# Patient Record
Sex: Female | Born: 1970 | Race: White | Hispanic: Yes | Marital: Married | Smoking: Never smoker
Health system: Southern US, Community
[De-identification: ages and names within clinical notes are randomized; demographics above are authoritative.]

## PROBLEM LIST (undated history)

## (undated) DIAGNOSIS — I10 Essential (primary) hypertension: Secondary | ICD-10-CM

## (undated) DIAGNOSIS — N912 Amenorrhea, unspecified: Secondary | ICD-10-CM

## (undated) DIAGNOSIS — M810 Age-related osteoporosis without current pathological fracture: Secondary | ICD-10-CM

## (undated) DIAGNOSIS — E785 Hyperlipidemia, unspecified: Secondary | ICD-10-CM

## (undated) DIAGNOSIS — J9859 Other diseases of mediastinum, not elsewhere classified: Secondary | ICD-10-CM

## (undated) DIAGNOSIS — F439 Reaction to severe stress, unspecified: Secondary | ICD-10-CM

## (undated) DIAGNOSIS — M1990 Unspecified osteoarthritis, unspecified site: Secondary | ICD-10-CM

## (undated) DIAGNOSIS — G4730 Sleep apnea, unspecified: Secondary | ICD-10-CM

## (undated) DIAGNOSIS — K219 Gastro-esophageal reflux disease without esophagitis: Secondary | ICD-10-CM

## (undated) DIAGNOSIS — R7303 Prediabetes: Secondary | ICD-10-CM

## (undated) DIAGNOSIS — B977 Papillomavirus as the cause of diseases classified elsewhere: Secondary | ICD-10-CM

## (undated) DIAGNOSIS — R5383 Other fatigue: Secondary | ICD-10-CM

## (undated) DIAGNOSIS — G4733 Obstructive sleep apnea (adult) (pediatric): Secondary | ICD-10-CM

## (undated) DIAGNOSIS — R12 Heartburn: Secondary | ICD-10-CM

## (undated) DIAGNOSIS — D649 Anemia, unspecified: Secondary | ICD-10-CM

## (undated) DIAGNOSIS — H269 Unspecified cataract: Secondary | ICD-10-CM

## (undated) DIAGNOSIS — K279 Peptic ulcer, site unspecified, unspecified as acute or chronic, without hemorrhage or perforation: Secondary | ICD-10-CM

## (undated) HISTORY — DX: Papillomavirus as the cause of diseases classified elsewhere: B97.7

## (undated) HISTORY — DX: Unspecified cataract: H26.9

## (undated) HISTORY — DX: Hyperlipidemia, unspecified: E78.5

## (undated) HISTORY — DX: Essential (primary) hypertension: I10

## (undated) HISTORY — DX: Obstructive sleep apnea (adult) (pediatric): G47.33

## (undated) HISTORY — DX: Gastro-esophageal reflux disease without esophagitis: K21.9

## (undated) HISTORY — DX: Prediabetes: R73.03

## (undated) HISTORY — DX: Other fatigue: R53.83

## (undated) HISTORY — DX: Other diseases of mediastinum, not elsewhere classified: J98.59

## (undated) HISTORY — DX: Amenorrhea, unspecified: N91.2

## (undated) HISTORY — DX: Age-related osteoporosis without current pathological fracture: M81.0

## (undated) HISTORY — DX: Peptic ulcer, site unspecified, unspecified as acute or chronic, without hemorrhage or perforation: K27.9

## (undated) HISTORY — DX: Heartburn: R12

## (undated) HISTORY — DX: Reaction to severe stress, unspecified: F43.9

## (undated) HISTORY — DX: Unspecified osteoarthritis, unspecified site: M19.90

## (undated) HISTORY — PX: OTHER SURGICAL HISTORY: SHX169

## (undated) HISTORY — DX: Anemia, unspecified: D64.9

## (undated) HISTORY — DX: Sleep apnea, unspecified: G47.30

---

## 2010-05-21 ENCOUNTER — Encounter: Admission: RE | Admit: 2010-05-21 | Discharge: 2010-05-21 | Payer: Self-pay | Admitting: Specialist

## 2011-11-12 ENCOUNTER — Other Ambulatory Visit: Payer: Self-pay | Admitting: Physician Assistant

## 2011-11-12 ENCOUNTER — Ambulatory Visit
Admission: RE | Admit: 2011-11-12 | Discharge: 2011-11-12 | Disposition: A | Discharge status: Home / Self Care | Payer: No Typology Code available for payment source | Source: Ambulatory Visit | Attending: Physician Assistant | Admitting: Physician Assistant

## 2011-11-12 DIAGNOSIS — M542 Cervicalgia: Secondary | ICD-10-CM

## 2011-11-12 DIAGNOSIS — R200 Anesthesia of skin: Secondary | ICD-10-CM

## 2011-11-23 ENCOUNTER — Other Ambulatory Visit: Payer: Self-pay | Admitting: Specialist

## 2011-11-23 DIAGNOSIS — R109 Unspecified abdominal pain: Secondary | ICD-10-CM

## 2011-11-23 DIAGNOSIS — R110 Nausea: Secondary | ICD-10-CM

## 2011-11-25 ENCOUNTER — Ambulatory Visit
Admission: RE | Admit: 2011-11-25 | Discharge: 2011-11-25 | Disposition: A | Discharge status: Home / Self Care | Payer: No Typology Code available for payment source | Source: Ambulatory Visit | Attending: Specialist | Admitting: Specialist

## 2011-11-25 DIAGNOSIS — R109 Unspecified abdominal pain: Secondary | ICD-10-CM

## 2011-11-25 DIAGNOSIS — R110 Nausea: Secondary | ICD-10-CM

## 2013-12-25 IMAGING — US US ABDOMEN COMPLETE
1 series · 14 of 25 positions shown · non-contrast
Comparison: None.

CLINICAL DATA: Abdominal pain and nausea.

COMPLETE ABDOMINAL ULTRASOUND

[Series 1: us abdomen complete · 0.29mm/px · 14 of 87 slices shown]
[im 1/87]
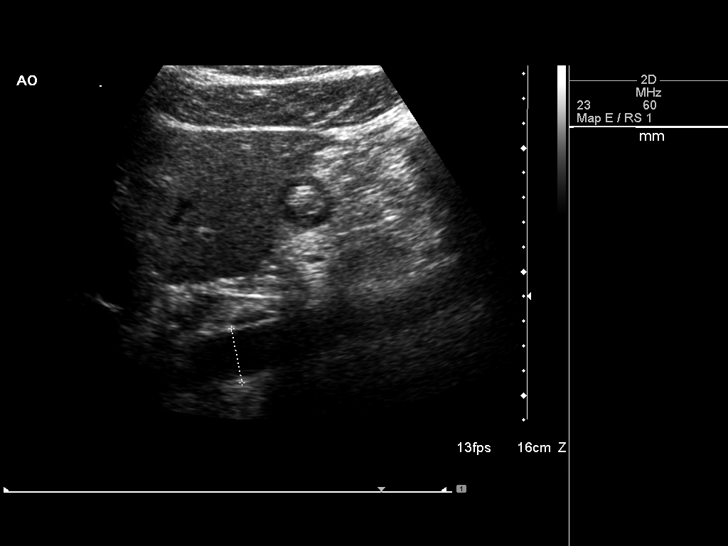
[im 8/87]
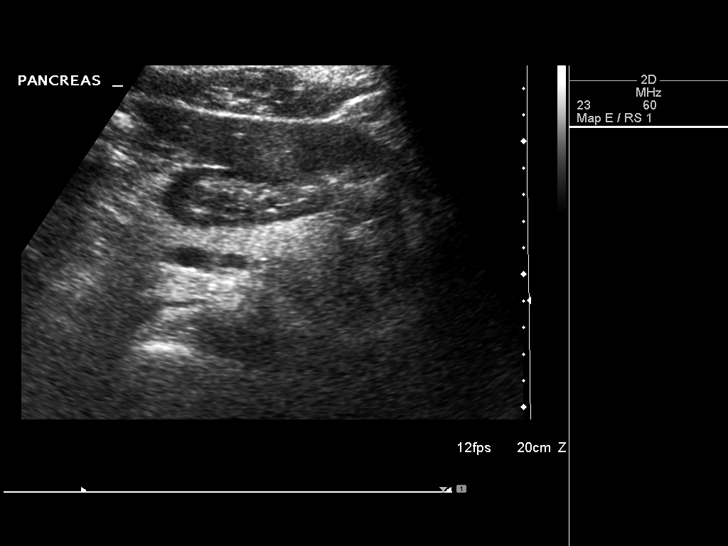
[im 15/87]
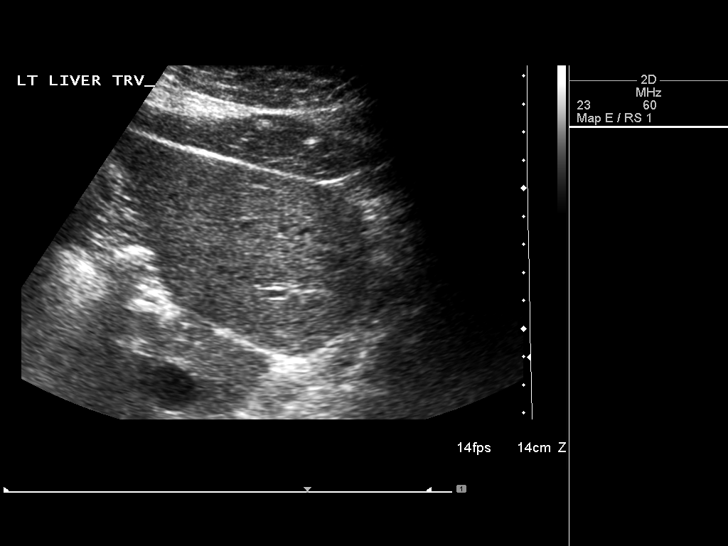
[im 22/87]
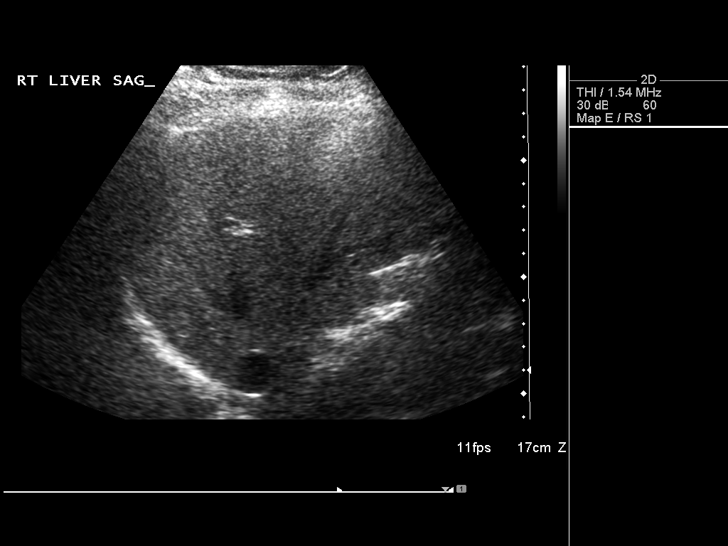
[im 29/87]
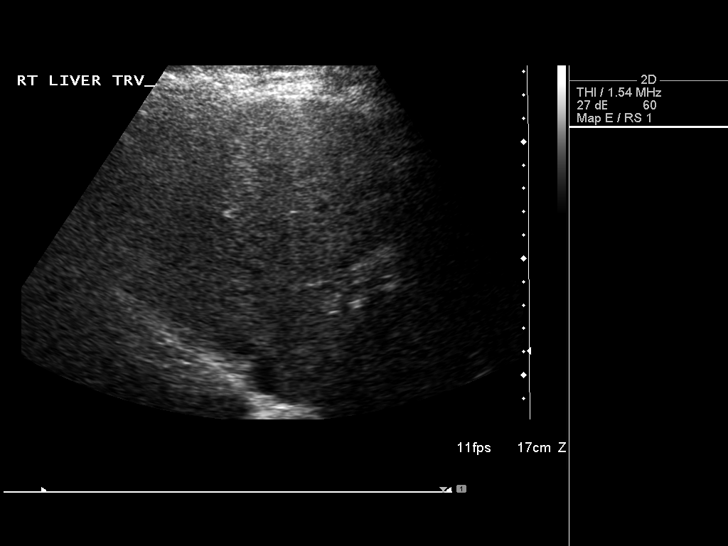
[im 33/87]
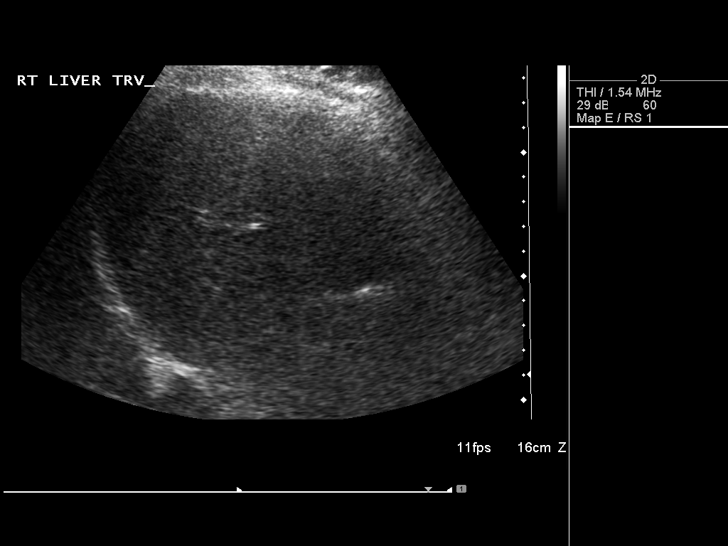
[im 40/87]
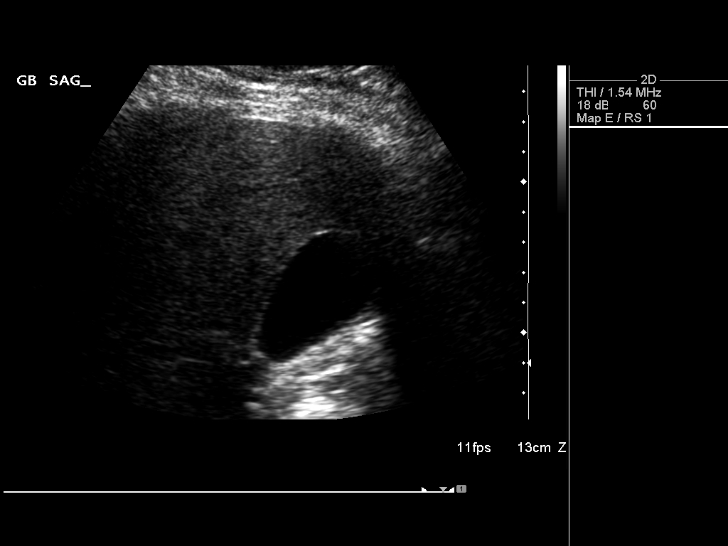
[im 47/87]
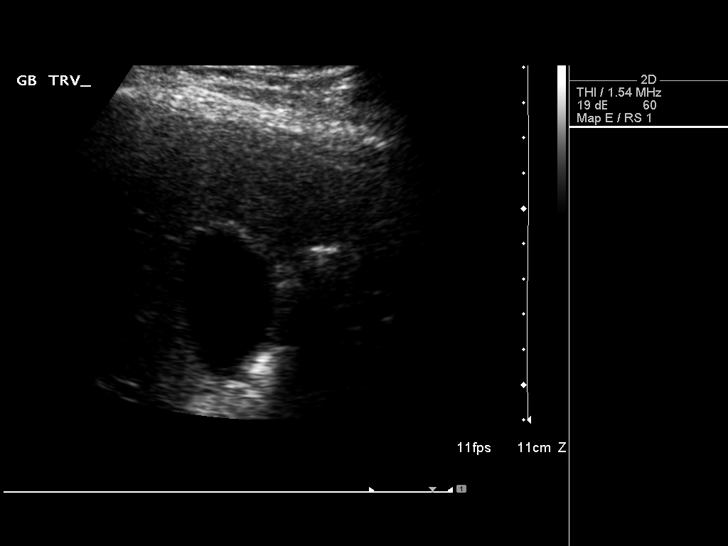
[im 54/87]
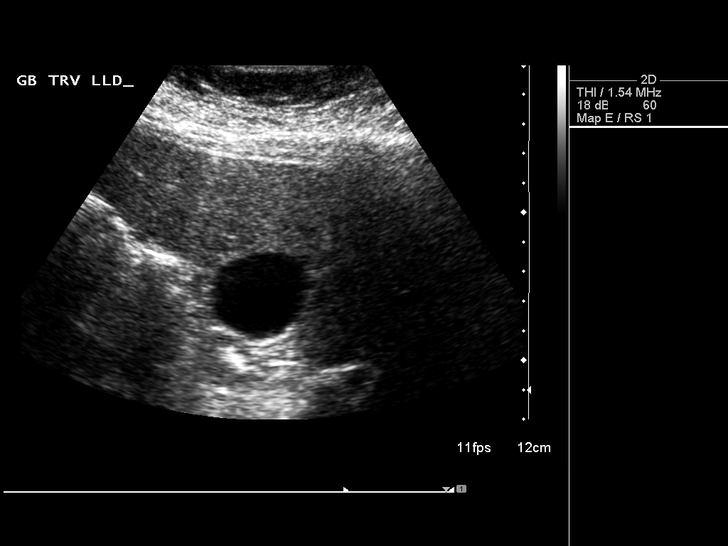
[im 58/87]
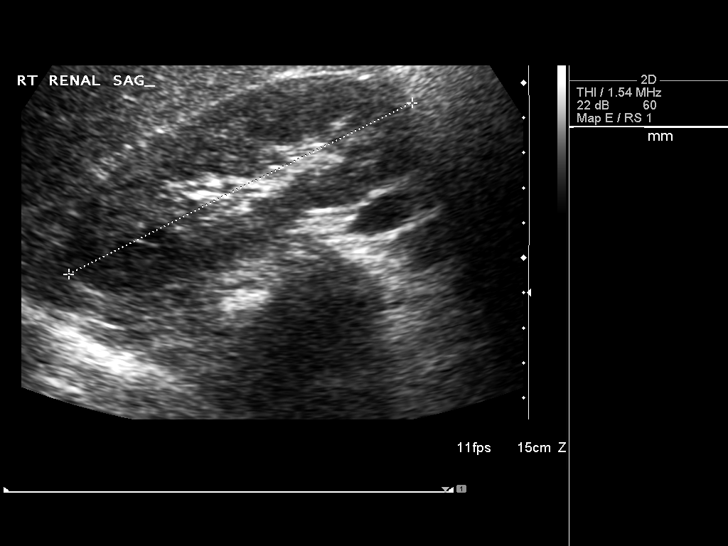
[im 65/87]
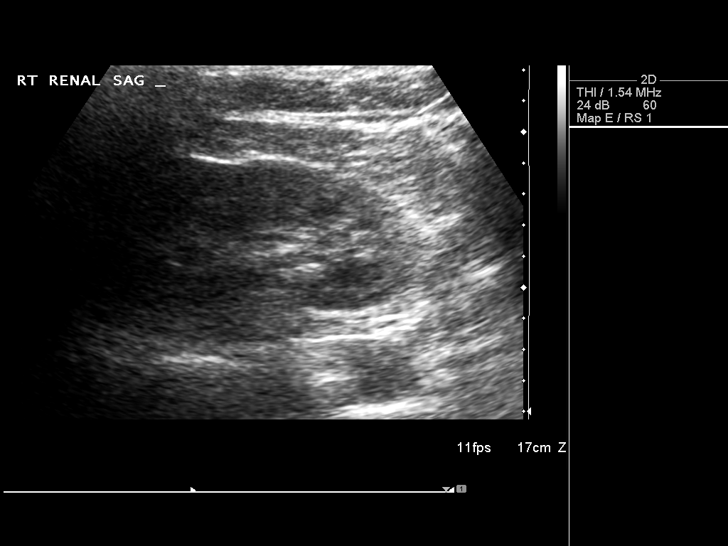
[im 72/87]
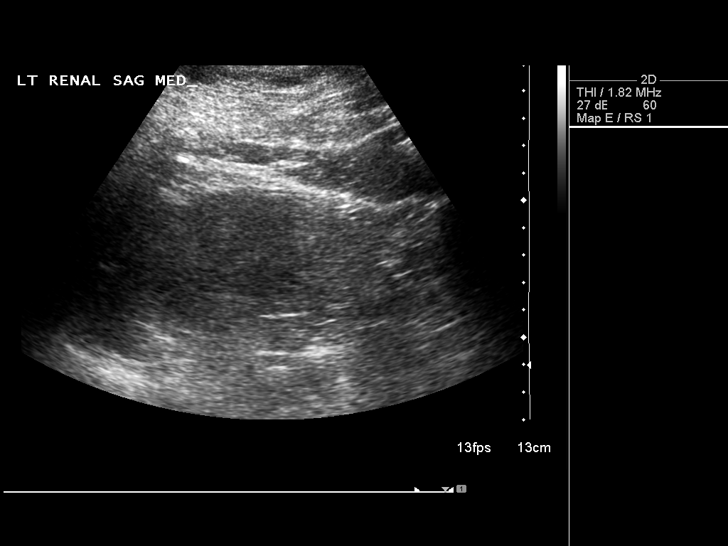
[im 79/87]
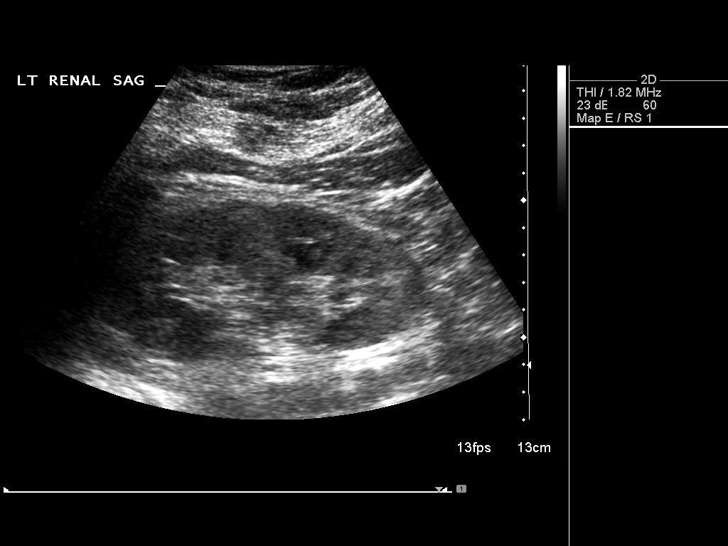
[im 87/87]
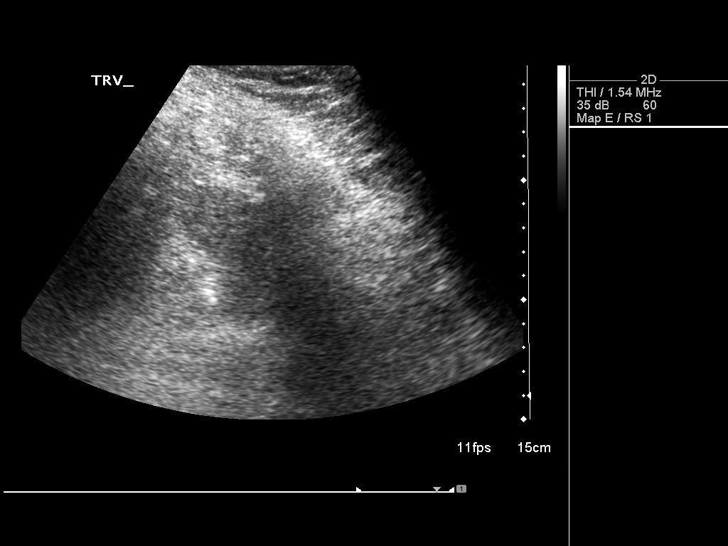

[14 of 25 positions shown; findings below may reference images not displayed]

FINDINGS: Gallbladder:  No gallstones, gallbladder wall thickening, or
pericholecystic fluid.

Common bile duct:  Normal in caliber measuring a maximum of 3.7mm.

Liver:  There is diffuse increased echogenicity of the liver,
decreased through transmission and poor definition of the liver
architecture consistent with fatty infiltration.  No focal lesions
or biliary dilatation.

IVC:  Normal caliber.

Pancreas:  Sonographically normal.

Spleen:  Normal size and echogenicity without focal lesions.

Right Kidney:  11.0 cm in length. Normal renal cortical thickness
and echogenicity without focal lesions or hydronephrosis.

Left Kidney:  12.0 cm in length. Normal renal cortical thickness
and echogenicity without focal lesions or hydronephrosis.

Abdominal aorta:  Normal caliber.
IMPRESSION: 1.  Normal sonographic appearance of the gallbladder and normal
caliber common bile duct.
2.  Diffuse fatty infiltration of the liver.
3.  Unremarkable sonographic appearance of the pancreas, spleen and
both kidneys.

## 2015-04-30 ENCOUNTER — Encounter: Payer: Self-pay | Admitting: Emergency Medicine

## 2019-04-24 ENCOUNTER — Other Ambulatory Visit: Payer: Self-pay

## 2019-04-24 DIAGNOSIS — Z20822 Contact with and (suspected) exposure to covid-19: Secondary | ICD-10-CM

## 2019-04-25 LAB — NOVEL CORONAVIRUS, NAA: SARS-CoV-2, NAA: NOT DETECTED

## 2019-05-01 ENCOUNTER — Encounter: Payer: Self-pay | Admitting: *Deleted

## 2019-05-02 ENCOUNTER — Encounter: Payer: Self-pay | Admitting: *Deleted

## 2019-05-02 ENCOUNTER — Telehealth: Payer: Self-pay | Admitting: *Deleted

## 2019-05-02 ENCOUNTER — Other Ambulatory Visit: Payer: Self-pay

## 2019-05-02 ENCOUNTER — Ambulatory Visit: Payer: Self-pay | Admitting: Neurology

## 2019-05-02 NOTE — Telephone Encounter (Signed)
New patient appt today at 2pm.  Pt requested to arrive at 1:30pm for check-in.  She arrived at 2:10pm.  She needed an interpreter to assist with appt.  Dr. Rhea Belton next patient was already here.  Unable to see her for a late appt today.  She was rescheduled by front desk.

## 2019-05-18 ENCOUNTER — Encounter: Payer: Self-pay | Admitting: Nurse Practitioner

## 2019-06-02 NOTE — Progress Notes (Deleted)
     06/02/2019 Kella Splinter [id number] 01-Oct-1970   HISTORY OF PRESENT ILLNESS:  Lenette Rau is a 48 year old female with a past medical history of arthritis, heartburn, peptic ulcer, pre-diabetes, obstructive sleep apnea.  Interpretor needed ?      Past Medical History:  Diagnosis Date  . Amenorrhea   . Anemia   . Arthritis   . Dyslipidemia   . Fatigue   . Heartburn   . 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 Mediastinal mass   . OSA (obstructive sleep apnea)   . Peptic ulcer   . Prediabetes   . Stress    *** The histories are not reviewed yet. Please review them in the "History" navigator section and refresh this [place].  reports that she has never smoked. She has never used smokeless tobacco. She reports previous alcohol use. She reports that she does not use drugs. family history is not on file. No Known Allergies    Outpatient Encounter Medications as of 06/04/2019  Medication Sig  . acetaminophen (TYLENOL) 500 MG tablet Take 500 mg by mouth as needed.  Marland Kitchen CALCIUM PO Take 1 tablet by mouth daily.  . Cholecalciferol (VITAMIN D3 PO) Take 1 tablet by mouth daily.  . ferrous sulfate 325 (65 FE) MG tablet Take 325 mg by mouth daily.  Marland Kitchen omeprazole (PRILOSEC) 40 MG capsule Take 40 mg by mouth daily.   No facility-administered encounter medications on file as of 06/04/2019.      REVIEW OF SYSTEMS  : All other systems reviewed and negative except where noted in the History of Present Illness.   PHYSICAL EXAM: There were no vitals taken for this visit. General: Well developed white female in no acute distress Head: Normocephalic and atraumatic Eyes:  sclerae anicteric,conjunctive pink. Ears: Normal auditory acuity Neck: Supple, no masses.  Lungs: Clear throughout to auscultation Heart: Regular rate and rhythm Abdomen: Soft, nontender, non distended. No masses or hepatomegaly noted. Normal  bowel sounds Rectal: *** Musculoskeletal: Symmetrical with no gross deformities  Skin: No lesions on visible extremities Extremities: No edema  Neurological: Alert oriented x 4, grossly nonfocal Cervical Nodes:  No significant cervical adenopathy Psychological:  Alert and cooperative. Normal mood and affect  ASSESSMENT AND PLAN:    CC:  Lonergan, Malia A, PA-C

## 2019-06-04 ENCOUNTER — Other Ambulatory Visit: Payer: Self-pay

## 2019-06-04 ENCOUNTER — Ambulatory Visit: Payer: Self-pay | Admitting: Nurse Practitioner

## 2019-06-04 DIAGNOSIS — Z20822 Contact with and (suspected) exposure to covid-19: Secondary | ICD-10-CM

## 2019-06-05 LAB — NOVEL CORONAVIRUS, NAA: SARS-CoV-2, NAA: DETECTED — AB

## 2019-07-02 ENCOUNTER — Ambulatory Visit: Payer: Self-pay | Admitting: Neurology

## 2019-07-02 ENCOUNTER — Encounter: Payer: Self-pay | Admitting: Neurology

## 2019-07-02 VITALS — BP 133/72 | HR 72 | Temp 97.0°F | Ht <= 58 in | Wt 162.5 lb

## 2019-07-02 DIAGNOSIS — R222 Localized swelling, mass and lump, trunk: Secondary | ICD-10-CM

## 2019-07-02 DIAGNOSIS — R937 Abnormal findings on diagnostic imaging of other parts of musculoskeletal system: Secondary | ICD-10-CM
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
## 2019-07-10 ENCOUNTER — Telehealth: Payer: Self-pay | Admitting: Neurology

## 2019-07-10 NOTE — Telephone Encounter (Signed)
Irving Burton per Dr. Zannie Cove note patient needs to have MRI in June 2021. Thanks Annabelle Harman

## 2019-07-18 ENCOUNTER — Ambulatory Visit: Payer: Self-pay | Admitting: Gastroenterology

## 2019-07-18 ENCOUNTER — Encounter: Payer: Self-pay | Admitting: Gastroenterology

## 2019-07-18 VITALS — BP 130/70 | HR 71 | Temp 97.0°F | Ht <= 58 in | Wt 161.0 lb

## 2019-07-18 DIAGNOSIS — Z800 Family history of malignant neoplasm of digestive organs: Secondary | ICD-10-CM

## 2019-07-18 DIAGNOSIS — K219 Gastro-esophageal reflux disease without esophagitis: Secondary | ICD-10-CM

## 2019-07-18 MED ORDER — PANTOPRAZOLE SODIUM 40 MG PO TBEC: 40.0000 mg | DELAYED_RELEASE_TABLET | Freq: Every day | ORAL | 3 refills | Status: DC

## 2019-07-18 NOTE — Patient Instructions (Addendum)
I have recommended an upper endoscopy for further of your symptoms.   There are several things you can do to help your symptoms: - Take your pantoprazole 40 mg 30 minutes before breakfast every day - Avoid any dietary triggers - Avoid spicy and acidic foods - Limit your intake of coffee, tea, alcohol, and carbonated drinks - Work to maintain a healthy weight - Keep the head of the bed elevated with blocks if you are having any nighttime symptoms - Stay upright for 2 hours after eating - Avoid meals and snacks three to four hours before bedtime - Do not smoke

## 2019-07-18 NOTE — Progress Notes (Signed)
Referring Provider: Marya Landry Primary Care Physician:  Sandre Kitty, PA-C  Reason for Consultation:  GERD   IMPRESSION:  Epigastric pain and reflux not responding to omeprazole 40 mg daily BMI 33 Family history of stomach cancer (paternal uncle and cousin) Clinically diagnosed PUD in 2003 Prior colonoscopy in [facility], no alarm features. I have recommended an EGD with esophageal and gastric biopsies given the differential of reflux esophagitis, persistent H pylori, PUD, gastritis, and non-erosive reflux disease and the patient's desire to proceed with endoscopy. Must also consider gastric malignancy, particularly given the family history.  Plan PPI BID. Consider TIF in the future if there is a desire to avoid medications.   Will obtain prior colonoscopy report to determine appropriate interval for next colonoscopy.   PLAN: Pantoprazole 40 mg daily  EGD with gastric biopsies Avoid all NSAIDs Reviewed GERD lifestyle modifications including working to achieve a healthy weight Obtain prior colonoscopy report from [facility] - may need colonoscopy at the time of EGD Consider abdominal imaging if EGD is non-diagnostic  The nature of the procedure, as well as the risks, benefits, and alternatives were carefully and thoroughly reviewed with the patient. Ample time for discussion and questions allowed. The patient understood, was satisfied, and agreed to proceed.  Please see the "Patient Instructions" section for addition details about the plan.  HPI: Amanda Hayes is a 49 y.o. female referred by Barry Brunner for further evaluation of reflux.  The history is obtained through the patient, review of her electronic health record, and records provided by her referring provider.  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 She has a history of  clinically diagnosed bleeding PUD in 2003. She is from [place].  Epigastric pain, bilateral upper abdominal pain and brash.  Initially started at the time of her bleeding ulcer in 2003.  Has recently become more frequent.  Occurs intermittent and most days.  Exacerbated by eating, particularly spicy foods. Frequent eructation.  Associated headaches and physical stress. No dysphagia, odynophagia, dysphonia, neck pain, sore throat No change in bowel habits or blood in the stool  No history of anemia Good appetite. Unintentional weight gain.  Treats with milk, pantoprazole PRN, and Prilosec x 28 days No other associated symptoms. No identified exacerbating or relieving features.   Paternal uncle with gastric cancer in his 6s, Paternal cousin with gastric cancer in his 69s.   Denies the use of NSAIDS.   Had a colonoscopy 5 years ago at [facility] in [facility] (?). 10 year surveillance recommended. No prior upper endoscopy.  No recent abdominal imaging.   No known family history of colon cancer or polyps. No other known family history of uterine/endometrial cancer, pancreatic cancer or gastric/stomach cancer.   Past Medical History:  Diagnosis Date  . Amenorrhea   . Anemia   . Arthritis   . Dyslipidemia   . Fatigue   . Heartburn   . 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 Mediastinal mass   . OSA (obstructive sleep apnea)   . Peptic ulcer   . Prediabetes   . Stress     Past Surgical History:  Procedure Laterality Date  . No past surgery      Current Outpatient Medications  Medication Sig Dispense Refill  . CALCIUM PO Take 1 tablet by  mouth as needed.     . Cholecalciferol (VITAMIN D3 PO) Take 1 tablet by mouth as needed.     . ferrous sulfate 325 (65 FE) MG tablet Take 325 mg by mouth as needed.      No current facility-administered medications for this visit.    Allergies as of 07/18/2019  . (No Known Allergies)     Family History  Problem Relation Age of Onset  . Healthy Mother   . Hypertension Father   . Thyroid disease Sister   . Stomach cancer Paternal Uncle   . Stomach cancer Cousin        paternal uncle's daughter    Social History   Socioeconomic History  . Marital status: Married    Spouse name: Not on file  . Number of children: 2  . Years of education: 8th   . Highest education level: Not on file  Occupational History  . Occupation: sews  Tobacco Use  . Smoking status: Never Smoker  . Smokeless tobacco: Never Used  Substance and Sexual Activity  . Alcohol use: Not Currently  . Drug use: Never  . Sexual activity: Not on file  Other Topics Concern  . Not on file  Social History Narrative   Lives at home with husband.   No daily caffeine use.   Right-handed.   Social Determinants of Health   Financial Resource Strain:   . Difficulty of Paying Living Expenses: Not on file  Food Insecurity:   . Worried About Programme researcher, broadcasting/film/video in the Last Year: Not on file  . Ran Out of Food in the Last Year: Not on file  Transportation Needs:   . Lack of Transportation (Medical): Not on file  . Lack of Transportation (Non-Medical): Not on file  Physical Activity:   . Days of Exercise per Week: Not on file  . Minutes of Exercise per Session: Not on file  Stress:   . Feeling of Stress : Not on file  Social Connections:   . Frequency of Communication with Friends and Family: Not on file  . Frequency of Social Gatherings with Friends and Family: Not on file  . Attends Religious Services: Not on file  . Active Member of Clubs or Organizations: Not on file  . Attends Banker Meetings: Not on file  . Marital Status: Not on file  Intimate Partner Violence:   . Fear of Current or Ex-Partner: Not on file  . Emotionally Abused: Not on file  . Physically Abused: Not on file  . Sexually Abused: Not on file    Review of Systems: 12 system ROS is negative except as noted  above.   Physical Exam: General:   Alert,  well-nourished, pleasant and cooperative in NAD Head:  Normocephalic and atraumatic. Eyes:  Sclera clear, no icterus.   Conjunctiva pink. Ears:  Normal auditory acuity. Nose:  No deformity, discharge,  or lesions. Mouth:  No deformity or lesions.   Neck:  Supple; no masses or thyromegaly. Lungs:  Clear throughout to auscultation.   No wheezes. Heart:  Regular rate and rhythm; no murmurs. Abdomen:  Soft,nontender, nondistended, normal bowel sounds, no rebound or guarding. No hepatosplenomegaly.   Rectal:  Deferred  Msk:  Symmetrical. No boney deformities LAD: No inguinal or umbilical LAD Extremities:  No clubbing or edema. Neurologic:  Alert and  oriented x4;  grossly nonfocal Skin:  Intact without significant lesions or rashes. Psych:  Alert and cooperative. Normal mood and affect.  Hazen Brumett L. Tarri Glenn, MD, MPH 07/18/2019, 10:02 AM

## 2019-07-20 ENCOUNTER — Other Ambulatory Visit: Payer: Self-pay

## 2019-07-20 ENCOUNTER — Encounter: Payer: Self-pay | Admitting: Gastroenterology

## 2019-07-20 ENCOUNTER — Ambulatory Visit (AMBULATORY_SURGERY_CENTER): Payer: Self-pay | Admitting: Gastroenterology

## 2019-07-20 VITALS — BP 98/46 | HR 65 | Temp 98.6°F | Resp 13 | Ht <= 58 in | Wt 161.0 lb

## 2019-07-20 DIAGNOSIS — K2950 Unspecified chronic gastritis without bleeding: Secondary | ICD-10-CM

## 2019-07-20 DIAGNOSIS — K2100 Gastro-esophageal reflux disease with esophagitis, without bleeding: Secondary | ICD-10-CM

## 2019-07-20 DIAGNOSIS — K219 Gastro-esophageal reflux disease without esophagitis: Secondary | ICD-10-CM

## 2019-07-20 DIAGNOSIS — K449 Diaphragmatic hernia without obstruction or gangrene: Secondary | ICD-10-CM

## 2019-07-20 MED ORDER — PANTOPRAZOLE SODIUM 40 MG PO TBEC: 40.0000 mg | DELAYED_RELEASE_TABLET | Freq: Two times a day (BID) | ORAL | 0 refills | Status: DC

## 2019-07-20 MED ORDER — SODIUM CHLORIDE 0.9 % IV SOLN: 500.0000 mL | Freq: Once | INTRAVENOUS | Status: DC

## 2019-07-20 NOTE — Op Note (Signed)
[facility] Patient Name: Amanda Hayes Procedure Date: 07/20/2019 9:48 AM MRN: [id number] Endoscopist: Tressia Danas MD, MD Age: 49 Referring MD:  Date of Birth: 1971/03/17 Gender: Female Account #: [id number] Procedure:                Upper GI endoscopy Indications:              Epigastric pain and reflux not responding to                            omeprazole 40 mg daily                           Family history of stomach cancer (paternal uncle                            and cousin) Medicines:                Monitored Anesthesia Care Procedure:                Pre-Anesthesia Assessment:                           - Prior to the procedure, a History and Physical                            was performed, and patient medications and                            allergies were reviewed. The patient's tolerance of                            previous anesthesia was also reviewed. The risks                            and benefits of the procedure and the sedation                            options and risks were discussed with the patient.                            All questions were answered, and informed consent                            was obtained. Prior Anticoagulants: The patient has                            taken no previous anticoagulant or antiplatelet                            agents. ASA Grade Assessment: II - A patient with                            mild systemic disease. After reviewing the risks  and benefits, the patient was deemed in                            satisfactory condition to undergo the procedure.                           After obtaining informed consent, the endoscope was                            passed under direct vision. Throughout the                            procedure, the patient's blood pressure, pulse, and                            oxygen saturations were monitored continuously. The              Endoscope was introduced through the mouth, and                            advanced to the third part of duodenum. The upper                            GI endoscopy was accomplished without difficulty.                            The patient tolerated the procedure well. Scope In: Scope Out: Findings:                 LA Grade A (one or more mucosal breaks less than 5                            mm, not extending between tops of 2 mucosal folds)                            esophagitis with no bleeding was found. Biopsies                            were taken with a cold forceps for histology.                            Estimated blood loss was minimal.                           The entire examined stomach was normal. Biopsies                            were taken with a cold forceps for histology.                            Estimated blood loss was minimal.                           A small hiatal hernia was present.  The cardia and gastric fundus were normal on                            retroflexion. Complications:            No immediate complications. Estimated blood loss:                            Minimal. Estimated Blood Loss:     Estimated blood loss was minimal. Impression:               - LA Grade A reflux esophagitis with no bleeding.                            Biopsied.                           - Normal stomach. Biopsied.                           - Small hiatal hernia. Recommendation:           - Patient has a contact number available for                            emergencies. The signs and symptoms of potential                            delayed complications were discussed with the                            patient. Return to normal activities tomorrow.                            Written discharge instructions were provided to the                            patient.                           - Resume previous diet.                            - Continue present medications. Increase                            pantoprazole to 40 mg BID x 8 weeks.                           - Await pathology results.                           - No aspirin, ibuprofen, naproxen, or other                            non-steroidal anti-inflammatory drugs.                           -  Follow-up in the office in 4-8 weeks, or earlier                            as needed. Thornton Park MD, MD 07/20/2019 10:03:30 AM This report has been signed electronically.

## 2019-07-20 NOTE — Progress Notes (Signed)
Called to room to assist during endoscopic procedure.  Patient ID and intended procedure confirmed with present staff. Received instructions for my participation in the procedure from the performing physician.  

## 2019-07-20 NOTE — Progress Notes (Signed)
Temp LC Vitals KA 

## 2019-07-20 NOTE — Patient Instructions (Signed)
No aspirin, ibuprofen, naproxen, other non-stroidal anti-inflammatory drugs (NSAIDS). Increase Pantoprazole to 40mg  twice daily for 8 weeks. Follow-up in the office in 4-8 weeks, or earlier as needed.   USTED TUVO UN PROCEDIMIENTO ENDOSCPICO HOY EN EL [facility]:   Lea el informe del procedimiento que se le entreg para cualquier pregunta especfica sobre lo que se .  Si el informe del examen no responde a sus preguntas, por favor llame a su gastroenterlogo para aclararlo.  Si usted solicit que no se le den Dentist de lo que se [employer] en su procedimiento al Clinical cytogeneticist va a cuidar, entonces el informe del procedimiento se ha incluido en un sobre sellado para que usted lo revise despus cuando le sea ms conveniente.   LO QUE PUEDE ESPERAR: Algunas sensaciones de hinchazn en el abdomen.  Puede tener ms gases de lo normal.  El caminar puede ayudarle a eliminar el aire que se le puso en el tracto gastrointestinal durante el procedimiento y reducir la hinchazn.  Si le hicieron una endoscopia inferior (como una colonoscopia o una sigmoidoscopia flexible), podra notar manchas de sangre en las heces fecales o en el papel higinico.  Si se someti a una preparacin intestinal para su procedimiento, es posible que no tenga una evacuacin intestinal normal durante [employer].   Tenga en cuenta:  Es posible que note un poco de irritacin y congestin en la nariz o algn drenaje.  Esto es debido al oxgeno [employer] durante su procedimiento.  No hay que preocuparse y esto debe desaparecer ms o [employer].     Despus de la endoscopia superior (EGD)  Vmitos de Regulatory affairs officer o material como caf molido   Dolor en el pecho o dolor debajo de los omplatos que antes no tena   Dolor o dificultad persistente para tragar  Falta de aire que antes no tena   Fiebre de 100F o ms  Heces fecales negras y pegajosas   Para asuntos urgentes o de Retail buyer, puede  comunicarse con un gastroenterlogo a cualquier hora llamando al [phone].  DIETA:  Recomendamos una comida pequea al principio, pero luego puede continuar con su dieta normal.  Tome muchos lquidos, [phone] las bebidas alcohlicas durante 24 horas.    ACTIVIDAD:  Debe planear tomarse las cosas con calma por el resto del da y no debe CONDUCIR ni usar maquinaria pesada Tax adviser (debido a los medicamentos de sedacin utilizados durante el examen).     SEGUIMIENTO: Nuestro personal llamar al nmero que aparece en su historial al siguiente da hbil de su procedimiento para ver cmo se siente y para responder cualquier pregunta o inquietud que pueda tener con respecto a la informacin que se le dio despus del procedimiento. Si no podemos contactarle, le dejaremos un mensaje.  Sin embargo, si se siente bien y no tiene Patent examiner, no es necesario que nos devuelva la llamada.  Asumiremos que ha regresado a sus actividades diarias normales sin incidentes. Si se le tomaron algunas biopsias, le contactaremos por telfono o por carta en las prximas 3 semanas.  Si no ha sabido English as a second language teacher biopsias en el transcurso de 3 semanas, por favor llmenos al [phone].   FIRMAS/CONFIDENCIALIDAD: Usted y/o el acompaante que le cuide han firmado documentos que se ingresarn en su historial mdico electrnico.  Estas firmas atestiguan el hecho de que la informacin anterior

## 2019-07-23 ENCOUNTER — Encounter: Payer: Self-pay | Admitting: *Deleted

## 2019-07-23 ENCOUNTER — Telehealth: Payer: Self-pay | Admitting: *Deleted

## 2019-07-23 NOTE — Telephone Encounter (Signed)
The patient has been scheduled for f/u on 08/31/19 at 1:50 pm. Letter in Spanish sent to patient.

## 2019-07-24 ENCOUNTER — Telehealth: Payer: Self-pay

## 2019-07-24 ENCOUNTER — Encounter: Payer: Self-pay | Admitting: Gastroenterology

## 2019-07-24 NOTE — Telephone Encounter (Signed)
  Follow up Call-  Call back number 07/20/2019  Post procedure Call Back phone  # [phone]  ASK FOR Arline Asp  Permission to leave phone message Yes  Some recent data might be hidden     Patient questions:  Do you have a fever, pain , or abdominal swelling? No. Pain Score  0 *  Have you tolerated food without any problems? Yes.    Have you been able to return to your normal activities? Yes.    Do you have any questions about your discharge instructions: Diet   No. Medications  No. Follow up visit  No.  Do you have questions or concerns about your Care? No.  Actions: * If pain score is 4 or above: No action needed, pain <4.  1. Have you developed a fever since your procedure? no  2.   Have you had an respiratory symptoms (SOB or cough) since your procedure? no  3.   Have you tested positive for COVID 19 since your procedure no  4.   Have you had any family members/close contacts diagnosed with the COVID 19 since your procedure?  no   If yes to any of these questions please route to Laverna Peace, RN and Jennye Boroughs, Charity fundraiser.

## 2019-08-07 NOTE — Telephone Encounter (Signed)
Noted thank you

## 2019-08-31 ENCOUNTER — Other Ambulatory Visit: Payer: Self-pay

## 2019-08-31 ENCOUNTER — Encounter: Payer: Self-pay | Admitting: Gastroenterology

## 2019-08-31 ENCOUNTER — Ambulatory Visit: Payer: Self-pay | Admitting: Gastroenterology

## 2019-08-31 VITALS — BP 126/72 | HR 84 | Temp 98.5°F | Ht <= 58 in | Wt 162.0 lb

## 2019-08-31 DIAGNOSIS — K2990 Gastroduodenitis, unspecified, without bleeding: Secondary | ICD-10-CM

## 2019-08-31 DIAGNOSIS — K2970 Gastritis, unspecified, without bleeding: Secondary | ICD-10-CM

## 2019-08-31 DIAGNOSIS — K219 Gastro-esophageal reflux disease without esophagitis: Secondary | ICD-10-CM

## 2019-08-31 MED ORDER — PANTOPRAZOLE SODIUM 40 MG PO TBEC: 40.0000 mg | DELAYED_RELEASE_TABLET | Freq: Two times a day (BID) | ORAL | 3 refills | Status: AC

## 2019-08-31 NOTE — Progress Notes (Addendum)
Referring Provider: Chrystie Nose Primary Care Physician:  Cathleen Corti, PA-C  Chief complaint:  Epigastric pain  IMPRESSION:  LA Class A reflux esophagitis and a small hiatal hernia H pylori negative gastritis Epigastric pain and reflux not responding to omeprazole 40 mg daily, likely due to above findings BMI 33 Family history of stomach cancer (paternal uncle in his 11s and cousin in his 63s) Clinically diagnosed PUD in 2003 Prior colonoscopy in [place]  GERD and gastritis: Clinically responding to PPI BID. Continue to avoid all NSAIDs. Attempt taper of PPI this summer if symptoms remain well controlled. Encouraged weight loss, as a healthy weigh would like improve her GI symptoms.   We have been unable to locate her colonoscopy report at [facility] or [place] [facility]. The patient agrees to bring in a copy of her records so that we can contact the appropriate office for the procedure report. Will review to determine appropriate interval for next colonoscopy.   PLAN: Pantoprazole 40 mg daily (90 day supply with 3 refills) Avoid all NSAIDs Reviewed GERD lifestyle modifications including working to achieve a healthy weight Follow-up in one year or earlier PRN, she declined formal follow-up before then   HPI: Amanda Hayes is a 49 y.o. female referred by Brantley Stage for further evaluation of reflux. She was seen in consultation 07/18/19 and had an EGD performed 07/20/19. She returns in scheduled follow-up.  The interval history is obtained through the patient through the assistance of an interpreter and review of her electronic health record.  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 She has a history of clinically diagnosed bleeding PUD in 2003. She is from [place]. Work has been slow because the supplies from [place] have not been arrived. She reports a  normal colonoscopy in [place] 5 years ago.   Initially seen for epigastric pain, bilateral upper abdominal pain and brash that started in 2003 but was occurring more frequently (most days).  Exacerbated by spicy foods. Associated with eructation, headaches, and stress.  Symptoms persisted despite treatment with milk, pantoprazole PRN, and Prilosec x 28 days  EGD 07/20/19 showed LA Grade A reflux esophagitis, a small hiatal hernia, and mild gastritis. Biopsies for H pylori were negative. Esophageal biopsies were normal. There was no eosinophilic esophagitis.   She returns in scheduled follow-up. Feeling much better. 100% adherent with medications including pantoprazole 40 mg BID. Avoiding spicy and greasy foods. Using almond milk with improvement. Identified dairy as a trigger. No new complaints or concerns today.   Continues to deny the use of any NSAIDs.   Past Medical History:  Diagnosis Date  . Amenorrhea   . Anemia   . Arthritis   . Cataract    bilateral eyes  no surgery  . Dyslipidemia   . Fatigue   . GERD (gastroesophageal reflux disease)   . Heartburn   . 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 Hypertension   . Mediastinal mass   . OSA (obstructive sleep apnea)   . Osteoporosis   . Peptic ulcer   . Prediabetes   . Sleep apnea   . Stress     Past Surgical History:  Procedure Laterality Date  . No past surgery      Current Outpatient Medications  Medication Sig Dispense Refill  . CALCIUM PO  Take 1 tablet by mouth as needed.     . Cholecalciferol (VITAMIN D3 PO) Take 1 tablet by mouth as needed.     . ferrous sulfate 325 (65 FE) MG tablet Take 325 mg by mouth as needed.     . pantoprazole (PROTONIX) 40 MG tablet Take 1 tablet (40 mg total) by mouth 2 (two) times daily. 112 tablet 0   No current facility-administered medications for this visit.    Allergies as of 08/31/2019  . (No Known Allergies)    Family  History  Problem Relation Age of Onset  . Healthy Mother   . Hypertension Father   . Thyroid disease Sister   . Stomach cancer Paternal Uncle   . Stomach cancer Cousin        paternal uncle's daughter    Social History   Socioeconomic History  . Marital status: Married    Spouse name: Not on file  . Number of children: 2  . Years of education: 8th   . Highest education level: Not on file  Occupational History  . Occupation: sews  Tobacco Use  . Smoking status: Never Smoker  . Smokeless tobacco: Never Used  Substance and Sexual Activity  . Alcohol use: Not Currently  . Drug use: Never  . Sexual activity: Not on file  Other Topics Concern  . Not on file  Social History Narrative   Lives at home with husband.   No daily caffeine use.   Right-handed.   Social Determinants of Health   Financial Resource Strain:   . Difficulty of Paying Living Expenses: Not on file  Food Insecurity:   . Worried About Programme researcher, broadcasting/film/video in the Last Year: Not on file  . Ran Out of Food in the Last Year: Not on file  Transportation Needs:   . Lack of Transportation (Medical): Not on file  . Lack of Transportation (Non-Medical): Not on file  Physical Activity:   . Days of Exercise per Week: Not on file  . Minutes of Exercise per Session: Not on file  Stress:   . Feeling of Stress : Not on file  Social Connections:   . Frequency of Communication with Friends and Family: Not on file  . Frequency of Social Gatherings with Friends and Family: Not on file  . Attends Religious Services: Not on file  . Active Member of Clubs or Organizations: Not on file  . Attends Banker Meetings: Not on file  . Marital Status: Not on file  Intimate Partner Violence:   . Fear of Current or Ex-Partner: Not on file  . Emotionally Abused: Not on file  . Physically Abused: Not on file  . Sexually Abused: Not on file   Physical Exam: Gen: Awake, alert, and oriented, and well  communicative. HEENT: EOMI, non-icteric sclera, NCAT, MMM  Neck: Normal movement of head and neck  Pulm: No labored breathing, speaking in full sentences without conversational dyspnea  Derm: No apparent lesions or bruising in visible field  MS: Moves all visible extremities without noticeable abnormality  Psych: Pleasant, cooperative, normal speech, thought processing seemingly intact     Lamyra Malcolm L. Orvan Falconer, MD, MPH 08/31/2019, 2:18 PM

## 2019-08-31 NOTE — Patient Instructions (Signed)
We have sent the following medications to your pharmacy for you to pick up at your convenience:  Pantoprazole 40 mg daily  Avoid Nsaids  Patient advised to avoid spicy, acidic, citrus, chocolate, mints, fruit and fruit juices.  Limit the intake of caffeine, alcohol and Soda.  Don't exercise too soon after eating.  Don't lie down within 3-4 hours of eating.  Elevate the head of your bed.  Follow up as needed.

## 2019-11-07 ENCOUNTER — Other Ambulatory Visit: Payer: Self-pay | Admitting: Obstetrics and Gynecology

## 2019-11-07 ENCOUNTER — Telehealth: Payer: Self-pay | Admitting: Neurology

## 2019-11-07 NOTE — Telephone Encounter (Signed)
Self pay order sent to GI. They will reach out to the patient to schedule for June

## 2019-11-07 NOTE — Telephone Encounter (Signed)
Self pay order sent to GI. They will reach out to the patient to schedule for June  

## 2020-01-31 ENCOUNTER — Ambulatory Visit: Payer: Self-pay | Admitting: Neurology

## 2020-03-20 ENCOUNTER — Ambulatory Visit: Payer: Self-pay | Admitting: Neurology

## 2020-03-20 ENCOUNTER — Encounter: Payer: Self-pay | Admitting: Neurology

## 2020-03-20 ENCOUNTER — Telehealth: Payer: Self-pay | Admitting: *Deleted

## 2020-03-20 NOTE — Telephone Encounter (Signed)
Patient was no show for follow up today. 

## 2020-12-09 ENCOUNTER — Other Ambulatory Visit: Payer: Self-pay

## 2020-12-09 ENCOUNTER — Ambulatory Visit: Payer: Self-pay | Admitting: *Deleted

## 2020-12-09 VITALS — BP 120/76 | Wt 155.6 lb

## 2020-12-09 DIAGNOSIS — Z1239 Encounter for other screening for malignant neoplasm of breast: Secondary | ICD-10-CM

## 2020-12-09 NOTE — Patient Instructions (Signed)
Explained breast self awareness with Clydette Ramirez-Giles. Patient did not need a Pap smear today due to last Pap smear was in April or May June per patient. Let her know [facility] will cover Pap smears every 3 years unless has a history of abnormal Pap smears. Referred patient to [facility] for a left breast biopsy per recommendation. Appointment scheduled Thursday, December 17, 2020 at 1430. Patient aware of appointment and will be there. Hoda Ramirez-Giles verbalized understanding.  Jalaiyah Throgmorton, Kathaleen Maser, RN 2:27 PM

## 2020-12-09 NOTE — Progress Notes (Signed)
Ms. Aarna Mihalko is a 50 y.o. female who presents to [facility] clinic today with no complaints. Patient referred to [facility] by [facility] due to recommending a left breast biopsy. Screening mammogram completed 10/17/2020 and left breast diagnostic mammogram completed 11/26/2020 at [facility].    Pap Smear: Pap smear not completed today. Last Pap smear was in April or May 2021 at [facility] clinic and was normal per patient. Per patient has history of an abnormal Pap smear 02/19/2008 that no follow-up was completed. Last Pap smear result is not available in Epic.   Physical exam: Breasts Breasts symmetrical. No skin abnormalities bilateral breasts. No nipple retraction bilateral breasts. No nipple discharge bilateral breasts. No lymphadenopathy. No lumps palpated bilateral breasts. No complaints of pain or tenderness on exam.      Pelvic/Bimanual Pap is not indicated today per [facility] guidelines.   Smoking History: Patient has never smoked.   Patient Navigation: Patient education provided. Access to services provided for patient through [facility] program. Spanish interpreter Natale Lay from [facility] provided.   Colorectal Cancer Screening: Per patient had a colonoscopy completed 7 years ago. No complaints today.    Breast and Cervical Cancer Risk Assessment: Patient does not have family history of breast cancer, known genetic mutations, or radiation treatment to the chest before age 15. Patient does not have history of cervical dysplasia, immunocompromised, or DES exposure in-utero.  Risk Assessment    Risk Scores      12/09/2020   Last edited by: Narda Rutherford, LPN   5-year risk: 0.6 %   Lifetime risk: 4.9 %          A: [facility] exam without pap smear No complaints.  P: Referred patient to [facility] for a left breast biopsy per recommendation. Appointment scheduled Thursday, December 17, 2020 at 1430.  Priscille Heidelberg, RN 12/09/2020 2:27 PM

## 2021-01-12 NOTE — Progress Notes (Signed)
Scanner error. Re-scanned.

## 2023-02-22 ENCOUNTER — Encounter: Payer: Self-pay | Admitting: Radiology

## 2023-03-28 ENCOUNTER — Telehealth: Payer: Self-pay

## 2023-03-28 NOTE — Telephone Encounter (Signed)
I've contacted [facility] at [phone] and spoke with [place]. She advised that she will message the medical records coordinator to fax patients PAP results from May 2021 to me at [phone].

## 2023-03-31 ENCOUNTER — Ambulatory Visit: Payer: Self-pay

## 2023-05-02 ENCOUNTER — Encounter: Payer: Self-pay | Admitting: Internal Medicine
